# Patient Record
Sex: Male | Born: 1985 | Race: White | Hispanic: No | Marital: Single | Smoking: Never smoker
Health system: Southern US, Community
[De-identification: ages and names within clinical notes are randomized; demographics above are authoritative.]

---
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
## 2008-05-25 ENCOUNTER — Emergency Department (HOSPITAL_COMMUNITY): Admission: EM | Admit: 2008-05-25 | Discharge: 2008-05-25 | Payer: Self-pay | Admitting: Emergency Medicine

## 2008-09-11 ENCOUNTER — Ambulatory Visit: Payer: Self-pay | Admitting: Internal Medicine

## 2011-04-18 NOTE — Letter (Signed)
September 11, 2008    Mario Pennington, M.D.  [address]  [place], [place] [address]   RE:  Mario Pennington  MRN:  [id number]  /  DOB:  07-03-1986   Dear Mario Pennington,   It is a pleasure seeing Mario Pennington today in consultation because of  his syncope as well as palpitations in the context of this being sleepy.  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  These occurred mostly prior to running.  They were able  to be stopped by evidently stand on his head which his mother actually  helped him to do as she was there as a cross [employer] planning/management officer.  These  spells terminated within 2-4 minutes.  They were characterized with  being abrupt in onset associated with shortness of breath,  lightheadedness, and roughly terminating.   What happened in May was that he had an episode that lasted 4 or 5  minutes, unassociated with exertion, quite similar to the others.  These  were all frog-negative and diuretic negative.  He was left with  significant fatigue.  He saw a doctor over [place] about these.  No  specific diagnosis was made.  It is not clear whether he had an echo  done in either period.   Subsequently, he also had, I think it was earlier this summer, though I  do not remember for sure, an episode on his way home from [place] where he began to get sick to his stomach.  He got up to go to  the bathroom to defecate.  He got inside the airplane lavatory and  collapsed.  He awakened to find himself on the floor.  He got up and put  himself on the commode.  He still felt the urge to defecate.  This was  associated initially with worsening of symptoms and then improving with  symptoms with the change in orthostatic position.  His symptoms then  abated.  He has had no prior syncope and no presyncope.  He does have a  little bit of orthostatic lightheadedness.   PAST MEDICAL HISTORY:   Otherwise, broadly negative.   PAST SURGICAL HISTORY:  Negative.   REVIEW OF SYSTEMS:  Negative over multiple organ systems.  Uses little  caffeine.  He does not use cigarettes, alcohol, or recreational drugs.   MEDICATIONS:  He takes no medications.   ALLERGIES:  He has no allergies.   PHYSICAL EXAMINATION:  VITAL SIGNS:  On examination, his blood pressure  was 100/60 with pulse of 68 lying, standing at 5 minutes was 118/80 with  a pulse of 84.  HEENT:  Demonstrated no icterus or xanthomata.  NECK:  Veins were flat.  The carotids were brisk full bilaterally  without bruits.  BACK:  Without kyphosis, scoliosis.  LUNGS:  Clear.  HEART:  Sounds were regular without murmurs or gallops.  ABDOMEN:  Soft with active bowel sounds without midline pulsation or  hepatomegaly.  EXTREMITIES:  Femoral pulses are 2+, distal pulses were intact.  There  is no clubbing, cyanosis, or edema.  NEUROLOGIC:  Grossly normal.  SKIN:  Warm and dry.   Electrocardiogram dated today demonstrated sinus rhythm at 68 with  intervals of 0.19/0.09/0.38, the axis was 75 degrees.  There is no  evidence of ventricular pre-excitation.  There was an R prime, S prime,  and lead V1, and repolarization  abnormalities across the precordium  consistent with persistent juvenile pattern.   IMPRESSION:  1. Recurrent tachy-palpitations almost certainly supraventricular      tachycardia.  2. Syncope neurally mediated associated wit the gastrointestinal      prodrome.   Mario Pennington, Mario Pennington has almost certainly SVT.  We discussed the associated  risks, which are thought to be quite minimal in the absence of  ventricular pre-excitation.  We discussed potential treatment options  including doing nothing, I clarified vagal maneuvers for him and his  mother, rate control, taking these on a daily basis or on as-needed  basis, antiarrhythmic drug therapy as well as EP study and catheter  ablation.  We discussed potential benefits  and risks of the latter  including but not limited to death, perforation, heart block requirng  pacemaker implantation.   At the end, it was felt by all that a watchful waiting would be most  appropriate.  Vagal maneuvers were reviewed as noted.   As relates of syncope, I suspect it was neurally mediated and the GI  discomfort was either an autocrine mainfestation,  concomitant or  perhaps even the trigger.  Given the fact that he had a prolonged  prodrome without palpitations, I do not think this was triggered by  tachycardia.  As you know, SVT can be a trigger for people who are  status post neurally mediated syncope.   We will plan to see him again at his request.  I advised him in addition  of vagal maneuvers with recurrence of the prodrome to become supine.   If there is anything further we could do, please do not hesitate to  contact me.    Sincerely,      Mario Salvia, MD, [facility]  Electronically Signed    SCK/MedQ  DD: 09/11/2008  DT: 09/11/2008  Job #: [id number]

## 2019-11-04 ENCOUNTER — Other Ambulatory Visit: Payer: Self-pay

## 2019-11-04 DIAGNOSIS — Z20822 Contact with and (suspected) exposure to covid-19: Secondary | ICD-10-CM

## 2019-11-06 LAB — NOVEL CORONAVIRUS, NAA: SARS-CoV-2, NAA: NOT DETECTED

## 2021-09-03 ENCOUNTER — Ambulatory Visit (INDEPENDENT_AMBULATORY_CARE_PROVIDER_SITE_OTHER): Payer: BC Managed Care – PPO

## 2021-09-03 ENCOUNTER — Other Ambulatory Visit: Payer: Self-pay

## 2021-09-03 ENCOUNTER — Encounter (HOSPITAL_COMMUNITY): Payer: Self-pay | Admitting: Emergency Medicine

## 2021-09-03 ENCOUNTER — Ambulatory Visit (HOSPITAL_COMMUNITY)
Admission: EM | Admit: 2021-09-03 | Discharge: 2021-09-03 | Disposition: A | Discharge status: Home / Self Care | Payer: BC Managed Care – PPO | Attending: Emergency Medicine | Admitting: Emergency Medicine

## 2021-09-03 DIAGNOSIS — U071 COVID-19: Secondary | ICD-10-CM | POA: Diagnosis not present

## 2021-09-03 DIAGNOSIS — R0602 Shortness of breath: Secondary | ICD-10-CM

## 2021-09-03 NOTE — ED Triage Notes (Signed)
Pt is present today with chest congestion, nasal congestion,cough, chills, hot sweats, weakness, labor breathing, and fatigue. Pt states sx started Wednesday. Pt states he also tested positive Wednesday evening for Covid

## 2021-09-03 NOTE — ED Provider Notes (Signed)
[facility]    CSN: [id number] Arrival date & time: 09/03/21  1026      History   Chief Complaint Chief Complaint  Patient presents with   Fatigue   Cough   Chills    HPI Mario Pennington is a 35 y.o. male.   Patient here for evaluation of shortness of breath and fatigue that has been progressively worse since this morning.  Patient did test positive for COVID on Wednesday.  Reports shortness of breath is worse on exertion.  Reports that he is pretty active usually.  Denies any history of asthma.  Denies any trauma, injury, or other precipitating event.  Denies any specific alleviating or aggravating factors.  Denies any fevers, chest pain, N/V/D, numbness, tingling, weakness, abdominal pain, or headaches.    The history is provided by the patient.  Cough Associated symptoms: shortness of breath    History reviewed. No pertinent past medical history.  There are no problems to display for this patient.   History reviewed. No pertinent surgical history.     Home Medications    Prior to Admission medications   Not on File    Family History History reviewed. No pertinent family history.  Social History Social History   Tobacco Use   Smoking status: Never   Smokeless tobacco: Never  Vaping Use   Vaping Use: Never used  Substance Use Topics   Alcohol use: Yes   Drug use: Never     Allergies   Patient has no known allergies.   Review of Systems Review of Systems  Constitutional:  Positive for fatigue.  Respiratory:  Positive for cough and shortness of breath.   All other systems reviewed and are negative.   Physical Exam Triage Vital Signs ED Triage Vitals  Enc Vitals Group     BP 09/03/21 1143 121/76     Pulse Rate 09/03/21 1143 75     Resp 09/03/21 1143 18     Temp 09/03/21 1143 98 F (36.7 C)     Temp src --      SpO2 09/03/21 1143 98 %     Weight --      Height --      Head Circumference --      Peak Flow --      Pain Score  09/03/21 1145 5     Pain Loc --      Pain Edu? --      Excl. in GC? --    No data found.  Updated Vital Signs BP 121/76   Pulse 75   Temp 98 F (36.7 C)   Resp 18   SpO2 98%   Visual Acuity Right Eye Distance:   Left Eye Distance:   Bilateral Distance:    Right Eye Near:   Left Eye Near:    Bilateral Near:     Physical Exam Vitals and nursing note reviewed.  Constitutional:      General: He is not in acute distress.    Appearance: Normal appearance. He is not ill-appearing, toxic-appearing or diaphoretic.  HENT:     Head: Normocephalic and atraumatic.  Eyes:     Conjunctiva/sclera: Conjunctivae normal.  Cardiovascular:     Rate and Rhythm: Normal rate.     Pulses: Normal pulses.     Heart sounds: Normal heart sounds.  Pulmonary:     Effort: Pulmonary effort is normal.     Breath sounds: Normal breath sounds.  Abdominal:  General: Abdomen is flat.  Musculoskeletal:        General: Normal range of motion.     Cervical back: Normal range of motion.  Skin:    General: Skin is warm and dry.  Neurological:     General: No focal deficit present.     Mental Status: He is alert and oriented to person, place, and time.  Psychiatric:        Mood and Affect: Mood normal.     UC Treatments / Results  Labs (all labs ordered are listed, but only abnormal results are displayed) Labs Reviewed - No data to display  EKG   Radiology DG Chest 2 View  Result Date: 09/03/2021 CLINICAL DATA:  Shortness of breath. EXAM: CHEST - 2 VIEW COMPARISON:  None. FINDINGS: The heart size and mediastinal contours are within normal limits. Both lungs are clear. The visualized skeletal structures are unremarkable. IMPRESSION: No active cardiopulmonary disease. Electronically Signed   By: Gerome Sam III M.D.   On: 09/03/2021 12:50    Procedures Procedures (including critical care time)  Medications Ordered in UC Medications - No data to display  Initial Impression /  Assessment and Plan / UC Course  I have reviewed the triage vital signs and the nursing notes.  Pertinent labs & imaging results that were available during my care of the patient were reviewed by me and considered in my medical decision making (see chart for details).    Assessment negative for red flags or concerns.  X-ray with no acute cardiopulmonary disease.  Likely just related to COVID-19.  Tylenol and or ibuprofen as needed.  Encourage fluids and rest.  Discussed conservative symptom management as described in discharge instructions.  Strict ED follow-up for any worsening symptoms. Final Clinical Impressions(s) / UC Diagnoses   Final diagnoses:  COVID-19     Discharge Instructions      You can take Tylenol and/or Ibuprofen as needed for fever reduction and pain relief.   For cough: honey 1/2 to 1 teaspoon (you can dilute the honey in water or another fluid).  You can also use guaifenesin and dextromethorphan for cough. You can use a humidifier for chest congestion and cough.  If you don't have a humidifier, you can sit in the bathroom with the hot shower running.     For sore throat: try warm salt water gargles, cepacol lozenges, throat spray, warm tea or water with lemon/honey, popsicles or ice, or OTC cold relief medicine for throat discomfort.    For congestion: take a daily anti-histamine like Zyrtec, Claritin, and a oral decongestant, such as pseudoephedrine.  You can also use Flonase 1-2 sprays in each nostril daily.    It is important to stay hydrated: drink plenty of fluids (water, gatorade/powerade/pedialyte, juices, or teas) to keep your throat moisturized and help further relieve irritation/discomfort.   Return or go to the Emergency Department if symptoms worsen or do not improve in the next few days.      ED Prescriptions   None    PDMP not reviewed this encounter.   Ivette Loyal, NP 09/03/21 1259

## 2021-09-03 NOTE — Discharge Instructions (Signed)

## 2022-05-11 IMAGING — DX DG CHEST 2V
2 series · 2 of 2 positions shown · non-contrast
Comparison: None.

CLINICAL DATA: Shortness of breath.

EXAM:
CHEST - 2 VIEW

[chest pa]
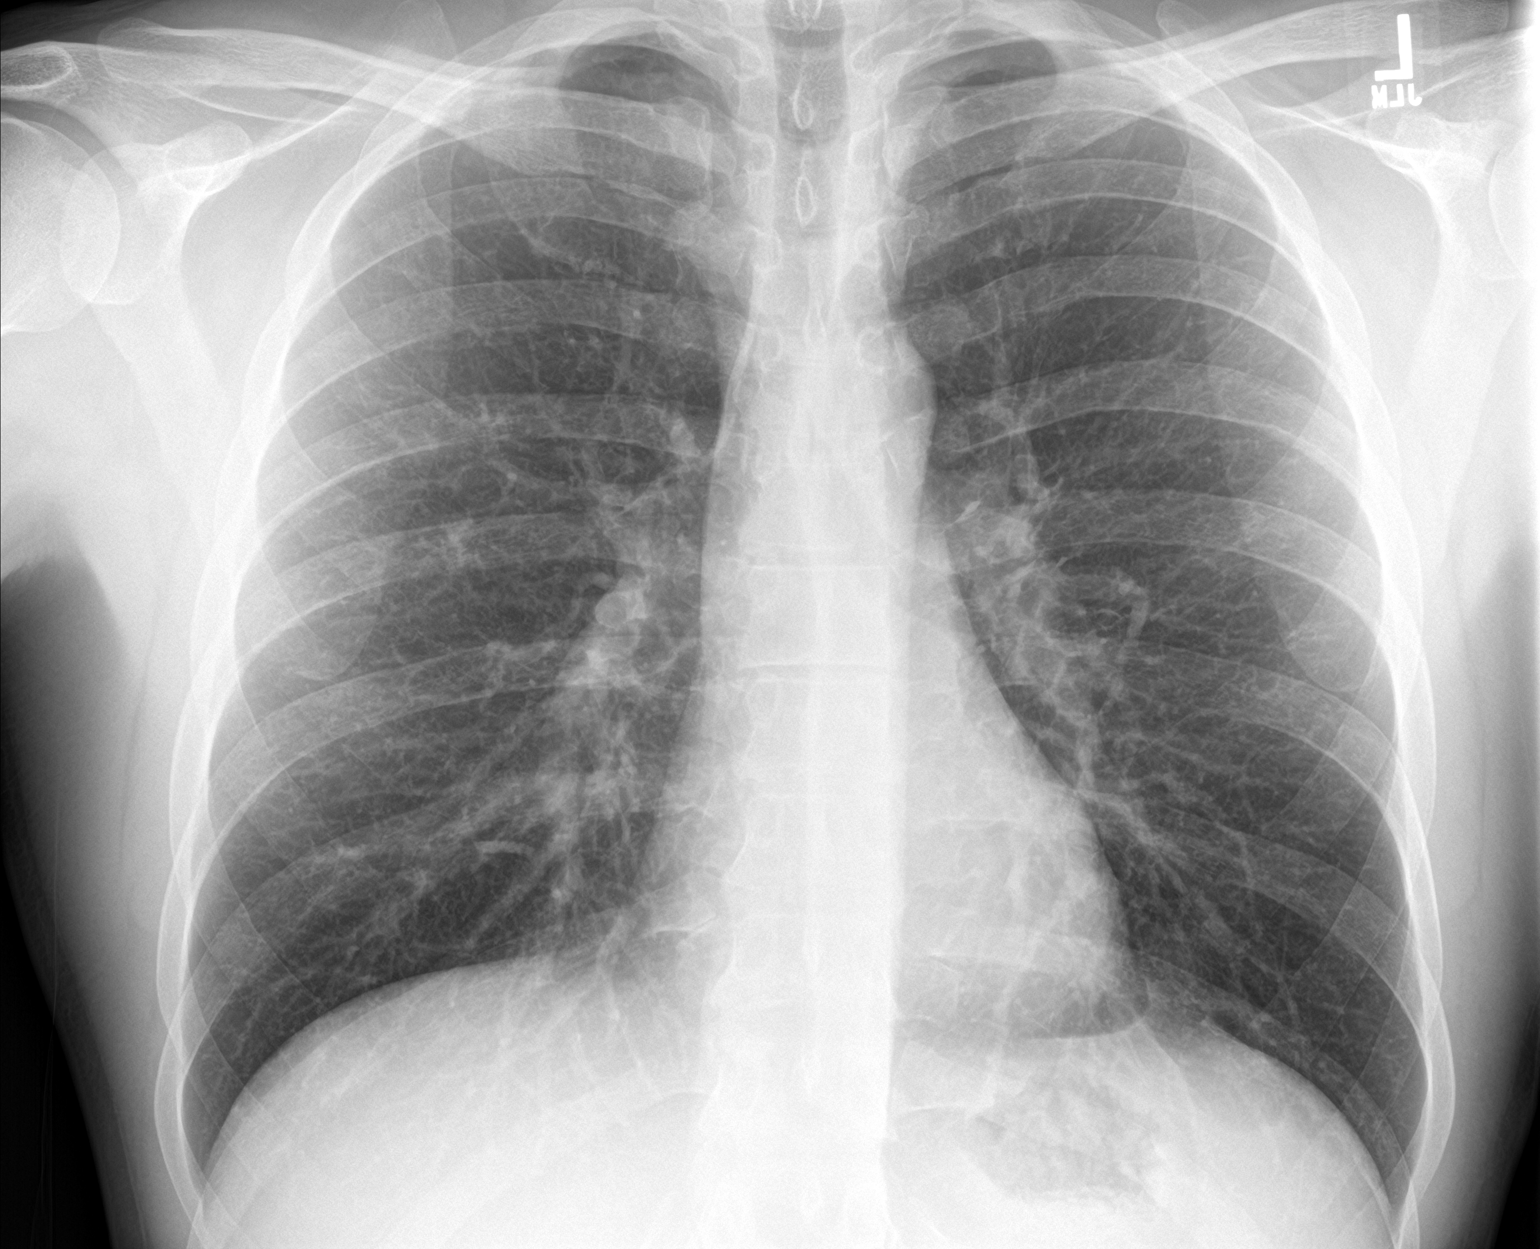

[chest lat]
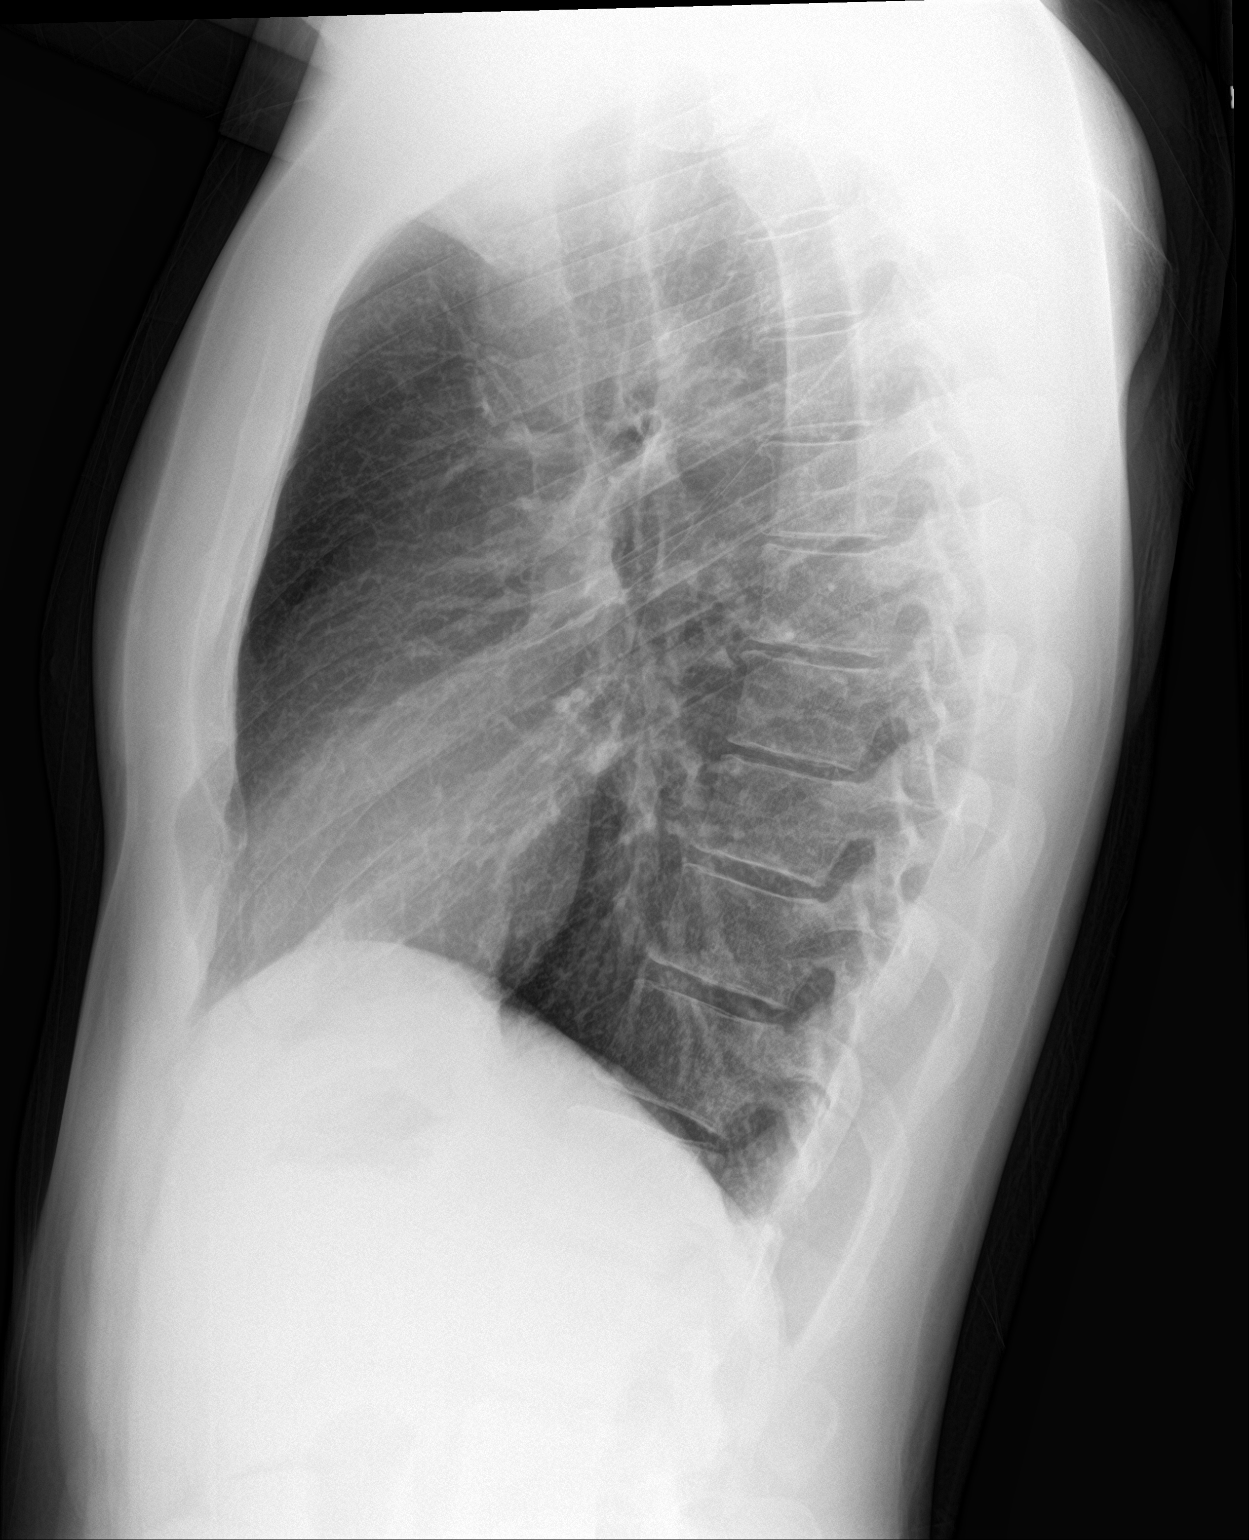

[2 of 2 positions shown; findings below may reference images not displayed]

FINDINGS: The heart size and mediastinal contours are within normal limits.
Both lungs are clear. The visualized skeletal structures are
unremarkable.
IMPRESSION: No active cardiopulmonary disease.
